# Patient Record
Sex: Male | Born: 1965 | Race: White | Hispanic: No | Marital: Married
Health system: Southern US, Community
[De-identification: ages and names within clinical notes are randomized; demographics above are authoritative.]

---

## 2004-09-28 ENCOUNTER — Emergency Department (HOSPITAL_COMMUNITY): Admission: EM | Admit: 2004-09-28 | Discharge: 2004-09-28 | Payer: Self-pay | Admitting: Emergency Medicine

## 2018-01-04 ENCOUNTER — Emergency Department (HOSPITAL_COMMUNITY): Payer: 59

## 2018-01-04 ENCOUNTER — Emergency Department (HOSPITAL_COMMUNITY)
Admission: EM | Admit: 2018-01-04 | Discharge: 2018-01-04 | Disposition: A | Discharge status: Home / Self Care | Payer: 59 | Attending: Physician Assistant | Admitting: Physician Assistant

## 2018-01-04 ENCOUNTER — Encounter (HOSPITAL_COMMUNITY): Payer: Self-pay

## 2018-01-04 ENCOUNTER — Other Ambulatory Visit: Payer: Self-pay

## 2018-01-04 DIAGNOSIS — S61011A Laceration without foreign body of right thumb without damage to nail, initial encounter: Secondary | ICD-10-CM | POA: Diagnosis not present

## 2018-01-04 DIAGNOSIS — W269XXA Contact with unspecified sharp object(s), initial encounter: Secondary | ICD-10-CM | POA: Diagnosis not present

## 2018-01-04 DIAGNOSIS — Y92411 Interstate highway as the place of occurrence of the external cause: Secondary | ICD-10-CM | POA: Diagnosis not present

## 2018-01-04 DIAGNOSIS — R079 Chest pain, unspecified: Secondary | ICD-10-CM | POA: Insufficient documentation

## 2018-01-04 DIAGNOSIS — Y999 Unspecified external cause status: Secondary | ICD-10-CM | POA: Diagnosis not present

## 2018-01-04 DIAGNOSIS — Z23 Encounter for immunization: Secondary | ICD-10-CM | POA: Insufficient documentation

## 2018-01-04 DIAGNOSIS — Y9389 Activity, other specified: Secondary | ICD-10-CM | POA: Insufficient documentation

## 2018-01-04 MED ORDER — CYCLOBENZAPRINE HCL 10 MG PO TABS: 10.0000 mg | ORAL_TABLET | Freq: Two times a day (BID) | ORAL | 0 refills | Status: AC | PRN

## 2018-01-04 MED ORDER — IBUPROFEN 800 MG PO TABS: 800.0000 mg | ORAL_TABLET | Freq: Three times a day (TID) | ORAL | 0 refills | Status: AC

## 2018-01-04 MED ORDER — TETANUS-DIPHTH-ACELL PERTUSSIS 5-2.5-18.5 LF-MCG/0.5 IM SUSP
0.5000 mL | Freq: Once | INTRAMUSCULAR | Status: AC
  Administered 2018-01-04: 0.5 mL via INTRAMUSCULAR
  Filled 2018-01-04: qty 0.5

## 2018-01-04 MED ORDER — LIDOCAINE HCL (PF) 1 % IJ SOLN
5.0000 mL | Freq: Once | INTRAMUSCULAR | Status: AC
  Administered 2018-01-04: 5 mL via INTRADERMAL
  Filled 2018-01-04: qty 5

## 2018-01-04 MED ORDER — IBUPROFEN 800 MG PO TABS
800.0000 mg | ORAL_TABLET | Freq: Once | ORAL | Status: AC
  Administered 2018-01-04: 800 mg via ORAL
  Filled 2018-01-04: qty 1
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
## 2018-01-04 NOTE — ED Notes (Signed)
Dermabond at bedside.  

## 2018-01-04 NOTE — ED Notes (Signed)
Signature pad not available at time of discharge. Pt verbalized understanding of discharge instructions and prescriptions. Pt given work note per EDP.
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
## 2018-01-04 NOTE — ED Notes (Signed)
Patient transported to X-ray 

## 2018-01-04 NOTE — ED Notes (Signed)
ED Provider at bedside. 
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
